# Patient Record
Sex: Male | Born: 1980 | Hispanic: Yes | Smoking: Never smoker
Health system: Southern US, Community
[De-identification: ages and names within clinical notes are randomized; demographics above are authoritative.]

---

## 2017-11-26 ENCOUNTER — Other Ambulatory Visit: Payer: Self-pay

## 2017-11-26 ENCOUNTER — Emergency Department: Payer: Self-pay

## 2017-11-26 DIAGNOSIS — G510 Bell's palsy: Secondary | ICD-10-CM | POA: Insufficient documentation

## 2017-11-26 LAB — DIFFERENTIAL
BASOS ABS: 0 10*3/uL (ref 0–0.1)
BASOS PCT: 0 %
EOS ABS: 0.7 10*3/uL (ref 0–0.7)
Eosinophils Relative: 7 %
Lymphocytes Relative: 28 %
Lymphs Abs: 2.9 10*3/uL (ref 1.0–3.6)
MONOS PCT: 7 %
Monocytes Absolute: 0.7 10*3/uL (ref 0.2–1.0)
NEUTROS ABS: 6.1 10*3/uL (ref 1.4–6.5)
Neutrophils Relative %: 58 %

## 2017-11-26 LAB — COMPREHENSIVE METABOLIC PANEL
ALBUMIN: 4.5 g/dL (ref 3.5–5.0)
ALT: 44 U/L (ref 17–63)
AST: 38 U/L (ref 15–41)
Alkaline Phosphatase: 88 U/L (ref 38–126)
Anion gap: 8 (ref 5–15)
BUN: 20 mg/dL (ref 6–20)
CO2: 26 mmol/L (ref 22–32)
Calcium: 9.3 mg/dL (ref 8.9–10.3)
Chloride: 106 mmol/L (ref 101–111)
Creatinine, Ser: 0.99 mg/dL (ref 0.61–1.24)
GFR calc Af Amer: >60 mL/min (ref >60)
Glucose, Bld: 125 mg/dL — ABNORMAL HIGH (ref 65–99)
POTASSIUM: 3.6 mmol/L (ref 3.5–5.1)
SODIUM: 140 mmol/L (ref 135–145)
Total Bilirubin: 0.7 mg/dL (ref 0.3–1.2)
Total Protein: 7.4 g/dL (ref 6.5–8.1)

## 2017-11-26 LAB — CBC
HCT: 40.8 % (ref 40.0–52.0)
Hemoglobin: 14.4 g/dL (ref 13.0–18.0)
MCH: 30.9 pg (ref 26.0–34.0)
MCHC: 35.3 g/dL (ref 32.0–36.0)
MCV: 87.5 fL (ref 80.0–100.0)
Platelets: 253 10*3/uL (ref 150–440)
RBC: 4.66 MIL/uL (ref 4.40–5.90)
RDW: 12.6 % (ref 11.5–14.5)
WBC: 10.4 10*3/uL (ref 3.8–10.6)

## 2017-11-26 LAB — TROPONIN I

## 2017-11-26 LAB — PROTIME-INR
INR: 0.87
Prothrombin Time: 11.7 seconds (ref 11.4–15.2)

## 2017-11-26 LAB — APTT: APTT: 30 s (ref 24–36)

## 2017-11-26 NOTE — ED Triage Notes (Signed)
Pt arrives to ED via POV from home with c/o facial droop and numbness that started yesterday morning. No numbness or weakness in the extremities, no trouble swallowing or breathing, some slurred speech reported. No reports of visual changes; pt reports posterior HA that started at the same time as the facial numbness. Bilateral grips are strong and equal, MAEW.

## 2017-11-27 ENCOUNTER — Emergency Department
Admission: EM | Admit: 2017-11-27 | Discharge: 2017-11-27 | Disposition: A | Discharge status: Home / Self Care | Payer: Self-pay | Attending: Emergency Medicine | Admitting: Emergency Medicine

## 2017-11-27 DIAGNOSIS — G510 Bell's palsy: Secondary | ICD-10-CM

## 2017-11-27 MED ORDER — VALACYCLOVIR HCL 500 MG PO TABS
1000.0000 mg | ORAL_TABLET | ORAL | Status: AC
  Administered 2017-11-27: 1000 mg via ORAL
  Filled 2017-11-27: qty 2

## 2017-11-27 MED ORDER — PREDNISONE 20 MG PO TABS: 60.0000 mg | ORAL_TABLET | Freq: Every day | ORAL | 0 refills | Status: DC

## 2017-11-27 MED ORDER — PREDNISONE 20 MG PO TABS
60.0000 mg | ORAL_TABLET | ORAL | Status: AC
  Administered 2017-11-27: 60 mg via ORAL
  Filled 2017-11-27: qty 3

## 2017-11-27 MED ORDER — VALACYCLOVIR HCL 1 G PO TABS: 1000.0000 mg | ORAL_TABLET | Freq: Three times a day (TID) | ORAL | 0 refills | Status: AC

## 2017-11-27 MED ORDER — PREDNISONE 20 MG PO TABS: 60.0000 mg | ORAL_TABLET | Freq: Every day | ORAL | 0 refills | Status: AC

## 2017-11-27 MED ORDER — VALACYCLOVIR HCL 1 G PO TABS: 1000.0000 mg | ORAL_TABLET | Freq: Three times a day (TID) | ORAL | 0 refills | Status: DC

## 2017-11-27 NOTE — ED Provider Notes (Signed)
[facility] Emergency Department Provider Note  ____________________________________________   First MD Initiated Contact with Patient 11/27/17 0114     (approximate)  I have reviewed the triage vital signs and the nursing notes.   HISTORY  Chief Complaint Facial Droop  The patient is a native [place] speaker but speaks English well and prefers to conduct the interview in [place].  HPI Daimen Jensen is a 37 y.o. male with no chronic medical issues and no recent illnesses who presents for evaluation of acute onset right-sided facial droop.  He was in his normal state of health when he went to bed on Wednesday night and then woke up on Thursday morning with an inability to completely close his right eye and with drooping of the right side of his mouth.  He is drooling when he tries to eat and drink and reports that he has some aching pain throughout the right side of his face and head and radiating down into his neck.  He has no history of trauma, no recent fever/chills, chest pain, shortness of breath, nausea, vomiting, abdominal pain, sore throat, or lesions on his face or ear.  Makes his symptoms better or worse.  He denies having any weakness or numbness in any of his extremities.  He has had no vertigo, double vision, nor ataxia.  Reports some slurred speech because his mouth does not work correctly but otherwise is speaking without any difficulty.  History reviewed. No pertinent past medical history.  There are no active problems to display for this patient.   History reviewed. No pertinent surgical history.  Prior to Admission medications   Medication Sig Start Date End Date Taking? Authorizing Provider  predniSONE (DELTASONE) 20 MG tablet Take 3 tablets (60 mg total) by mouth daily for 7 days. 11/27/17 12/04/17  Loleta Rose, MD  valACYclovir (VALTREX) 1000 MG tablet Take 1 tablet (1,000 mg total) by mouth 3 (three) times daily for 7 days. 11/27/17  12/04/17  Loleta Rose, MD    Allergies Amoxicillin  No family history on file.  Social History Social History   Tobacco Use  . Smoking status: Never Smoker  . Smokeless tobacco: Never Used  Substance Use Topics  . Alcohol use: Not on file  . Drug use: Not on file    Review of Systems Constitutional: No fever/chills Eyes: No visual changes, but cannot fully close his right eye.   ENT: No sore throat.  Some pain throughout the right side of his face and radiating down to the right side of his neck Cardiovascular: Denies chest pain. Respiratory: Denies shortness of breath. Gastrointestinal: No abdominal pain.  No nausea, no vomiting.  No diarrhea.  No constipation. Genitourinary: Negative for dysuria. Musculoskeletal: Negative for neck pain.  Negative for back pain. Integumentary: Negative for rash. Neurological: Mild posterior headache.  Profound right-sided facial droop, acute in onset about 24 hours ago.  No numbness nor weakness in any extremities. Psychiatric:No psychiatric complaints  ____________________________________________   PHYSICAL EXAM:  VITAL SIGNS: ED Triage Vitals  Enc Vitals Group     BP 11/26/17 2132 (!) 147/100     Pulse Rate 11/26/17 2132 68     Resp 11/26/17 2132 18     Temp 11/26/17 2132 98 F (36.7 C)     Temp Source 11/26/17 2132 Oral     SpO2 11/26/17 2132 98 %     Weight 11/26/17 2123 82.6 kg (182 lb)     Height 11/26/17 2123 1.753 m (5'  9")     Head Circumference --      Peak Flow --      Pain Score 11/26/17 2123 8     Pain Loc --      Pain Edu? --      Excl. in GC? --     Constitutional: Alert and oriented. Well appearing and in no acute distress.  Obvious right-sided facial droop as documented below. Eyes: Conjunctivae are normal. PERRL. EOMI. no nystagmus. Head: Atraumatic. Nose: No congestion/rhinnorhea. Ears: No evidence of vesicular lesions externally or within the ear canals.  Normal-appearing TMs. Mouth/Throat: Mucous  membranes are moist. Neck: No stridor.  No meningeal signs.   Cardiovascular: Normal rate, regular rhythm. Good peripheral circulation.  Respiratory: Normal respiratory effort.  No retractions.  Musculoskeletal: No lower extremity tenderness nor edema. No gross deformities of extremities. Neurologic: Obvious right-sided facial droop, inability to completely close the right eye, inability to smile on the right side of the mouth, inability to wrinkle the right side of his forehead.  No other focal neurological deficits are appreciated with normal strength throughout the rest of his body and no ataxia Skin:  Skin is warm, dry and intact. No rash noted. Psychiatric: Mood and affect are normal. Speech and behavior are normal.  ____________________________________________   LABS (all labs ordered are listed, but only abnormal results are displayed)  Labs Reviewed  COMPREHENSIVE METABOLIC PANEL - Abnormal; Notable for the following components:      Result Value   Glucose, Bld 125 (*)    All other components within normal limits  PROTIME-INR  APTT  CBC  DIFFERENTIAL  TROPONIN I   ____________________________________________  EKG  None - EKG not ordered by ED physician ____________________________________________  RADIOLOGY   Ct Head Wo Contrast  Result Date: 11/26/2017 CLINICAL DATA:  Facial droop and numbness. Slurred speech. Posterior headache. EXAM: CT HEAD WITHOUT CONTRAST TECHNIQUE: Contiguous axial images were obtained from the base of the skull through the vertex without intravenous contrast. COMPARISON:  None. FINDINGS: Brain: No evidence of acute infarction, hemorrhage, hydrocephalus, extra-axial collection or mass lesion/mass effect. Vascular: No hyperdense vessel or unexpected calcification. Skull: Normal. Negative for fracture or focal lesion. Sinuses/Orbits: No acute finding. Other: None. IMPRESSION: No acute intracranial abnormality. Electronically Signed   By: Ted Mcalpineobrinka   Dimitrova M.D.   On: 11/26/2017 21:49    ____________________________________________   PROCEDURES  Critical Care performed: No   Procedure(s) performed:   Procedures   ____________________________________________   INITIAL IMPRESSION / ASSESSMENT AND PLAN / ED COURSE  As part of my medical decision making, I reviewed the following data within the electronic MEDICAL RECORD NUMBER Nursing notes reviewed and incorporated and Labs reviewed     Differential diagnosis includes, but is not limited to, Bell's palsy, Ramsay Hunt syndrome, CVA, nonspecific facial nerve paralysis or neuritis, etc.  However, the patient's history of present illness and presentation is classic for Bell's palsy.  His CT scan obtained in triage was unremarkable.  The onset was acute, he is young and otherwise healthy, he has no other focal neural deficits, and he has an inability to wrinkle his forehead on the affected side.  All these are consistent with a peripheral cause.  He has no vesicular lesions in which would suggest more of a Ramsay Hunt presentation.  I explained to him the diagnosis and recommended treatment.  I provided coupons via [email or web address] to help with the cost of the medication.  I gave my usual and customary return precautions  and he understands and agrees with the plan.  I also stressed the importance of eye lubrication with ointment and drops and I provided tape and gauze for him to tape his eye shut at night for added protection.  Also provided information to help him establish a primary care doctor.     ____________________________________________  FINAL CLINICAL IMPRESSION(S) / ED DIAGNOSES  Final diagnoses:  Right-sided Bell's palsy     MEDICATIONS GIVEN DURING THIS VISIT:  Medications  predniSONE (DELTASONE) tablet 60 mg (60 mg Oral Given 11/27/17 0144)  valACYclovir (VALTREX) tablet 1,000 mg (1,000 mg Oral Given 11/27/17 0144)     ED Discharge Orders        Ordered    predniSONE  (DELTASONE) 20 MG tablet  Daily,   Status:  Discontinued     11/27/17 0143    valACYclovir (VALTREX) 1000 MG tablet  3 times daily,   Status:  Discontinued     11/27/17 0143    valACYclovir (VALTREX) 1000 MG tablet  3 times daily     11/27/17 0144    predniSONE (DELTASONE) 20 MG tablet  Daily     11/27/17 0144       Note:  This document was prepared using Dragon voice recognition software and may include unintentional dictation errors.    Loleta Rose, MD 11/27/17 [phone]

## 2017-11-27 NOTE — ED Notes (Signed)
Pt to the er for pain in his right eye, shoulder and ear. Difficult to close the right eye. Droop to the right side of the face. Pt having difficulty closing the right side of the mouth. Pt went to bed and woke this way. Dr York CeriseForbach to bedside.

## 2018-09-15 IMAGING — CT CT HEAD W/O CM
3 series · 15 of 46 positions shown, 18 images · non-contrast
Comparison: None.

CLINICAL DATA: Facial droop and numbness. Slurred speech. Posterior
headache.

EXAM:
CT HEAD WITHOUT CONTRAST
TECHNIQUE: Contiguous axial images were obtained from the base of the skull
through the vertex without intravenous contrast.

[Series 2: head wo · axial · 0.41mm/px · z∈[+430,+550]mm · 9 of 29 slices shown, 12 images]
[im 3/29  brain]
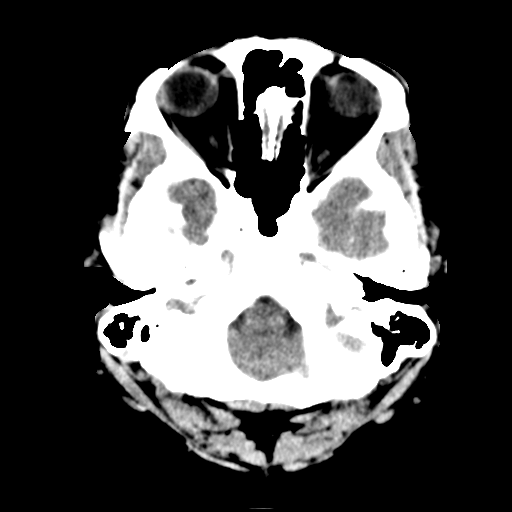
[im 3/29  bone]
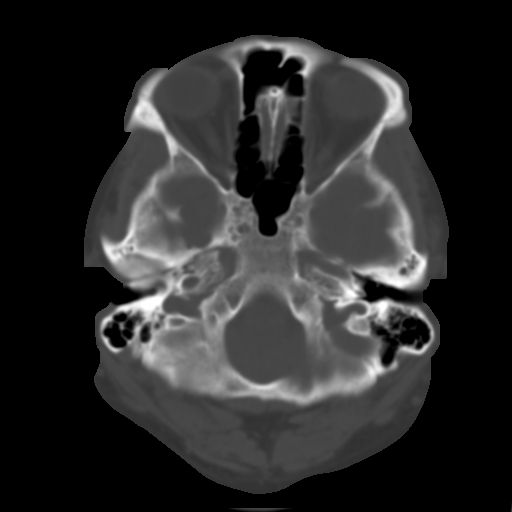
[im 6/29  brain]
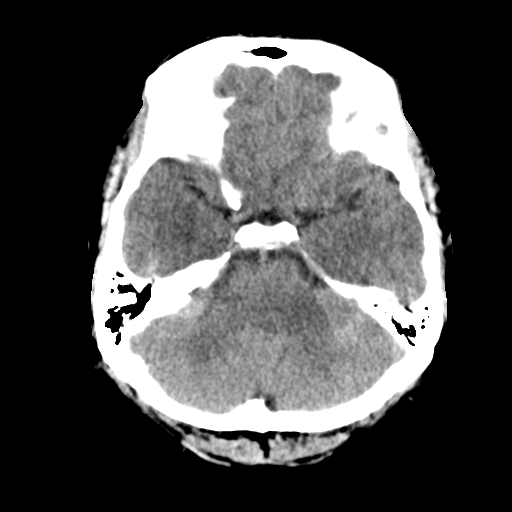
[im 9/29  brain]
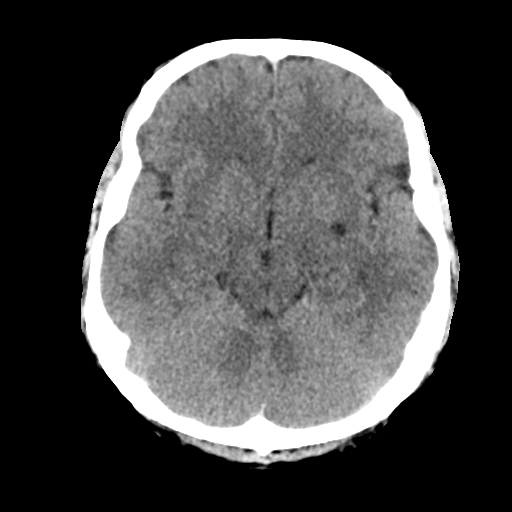
[im 12/29  brain]
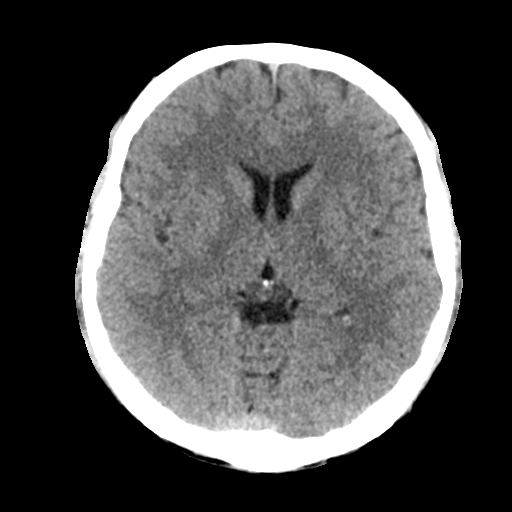
[im 15/29  brain]
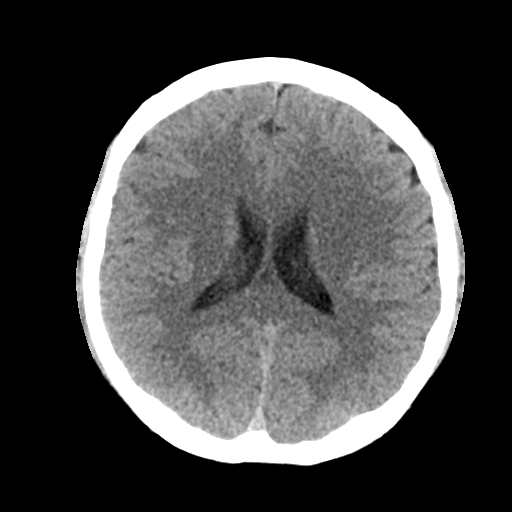
[im 15/29  bone]
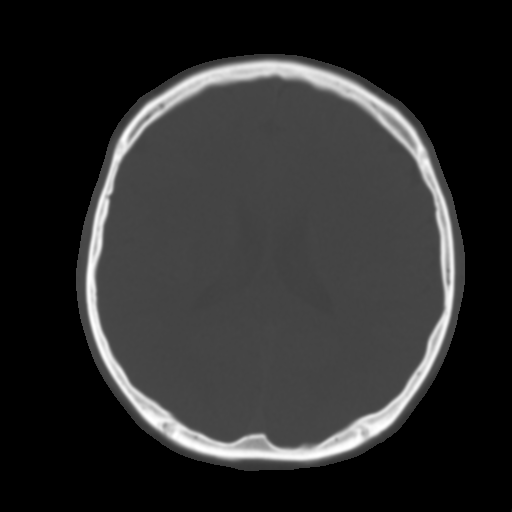
[im 18/29  brain]
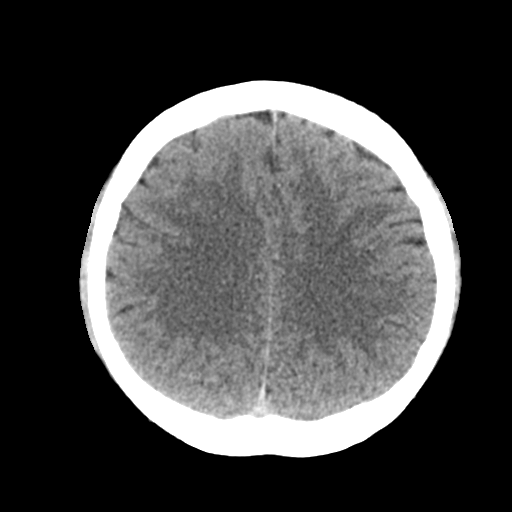
[im 21/29  brain]
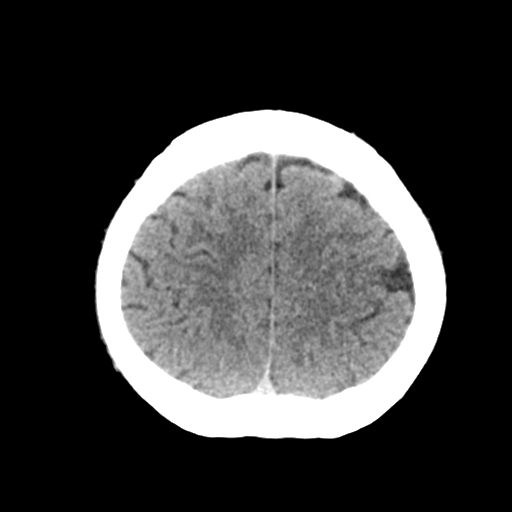
[im 24/29  brain]
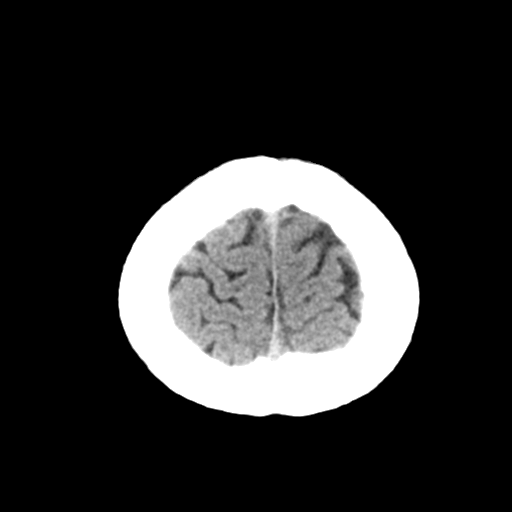
[im 27/29  brain]
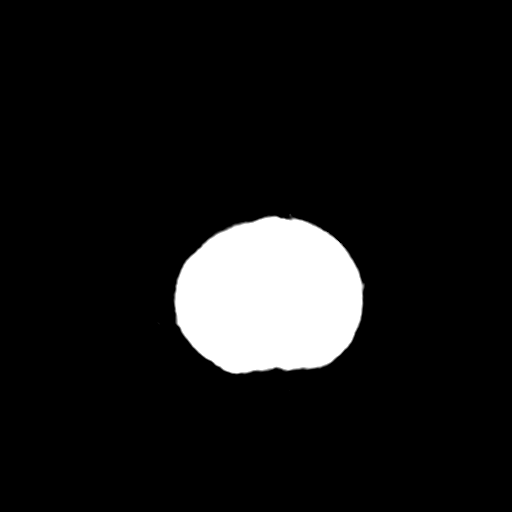
[im 27/29  bone]
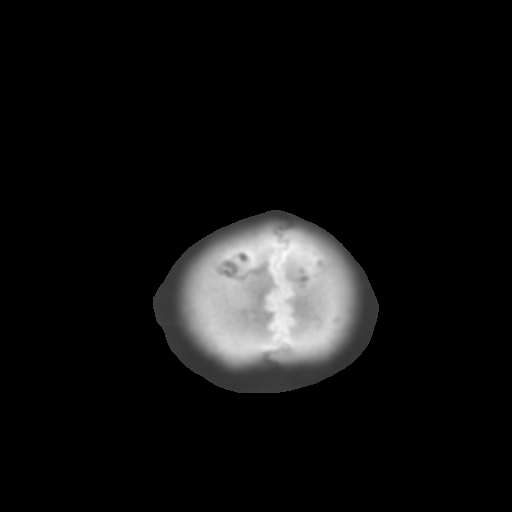

[Series 4: coronal soft tissue · coronal · 0.30mm/px · 3 of 62 slices shown]
[im 21/62  brain]
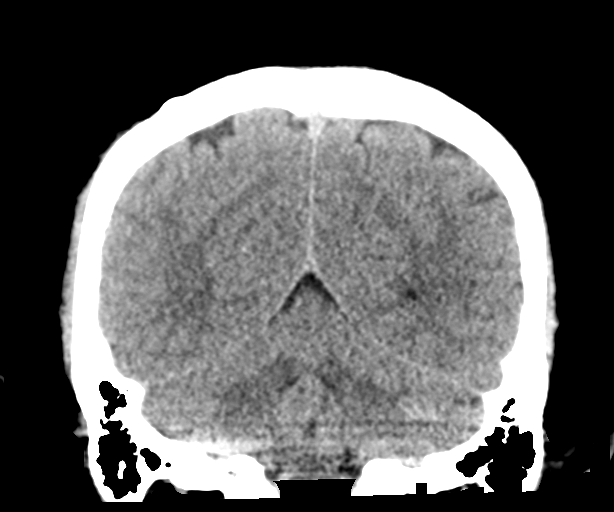
[im 28/62  brain]
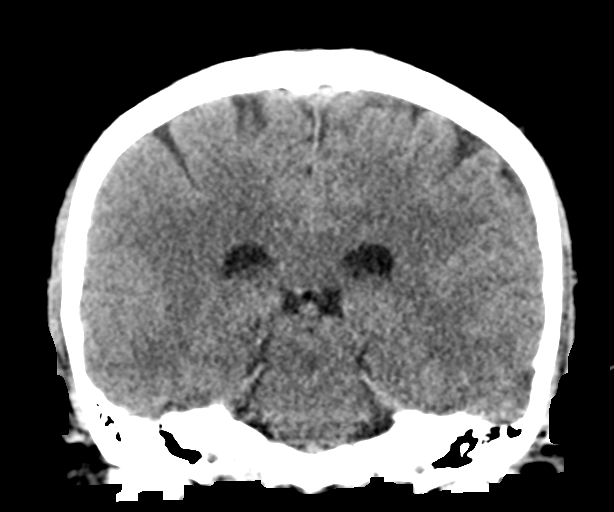
[im 34/62  brain]
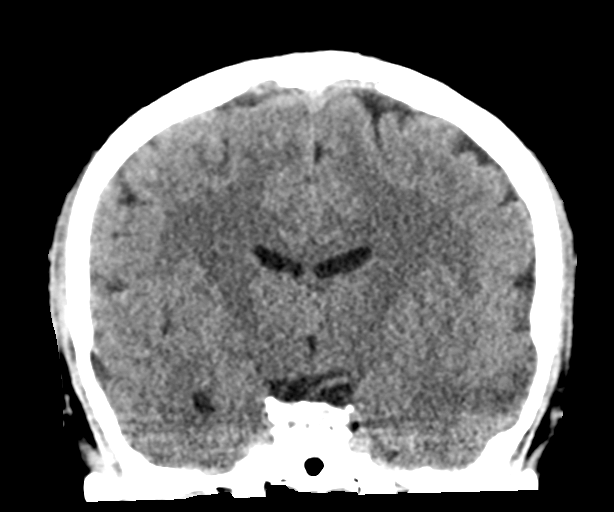

[Series 5: sagittal soft tissue · sagittal · 0.29mm/px · 3 of 57 slices shown]
[im 19/57  brain]
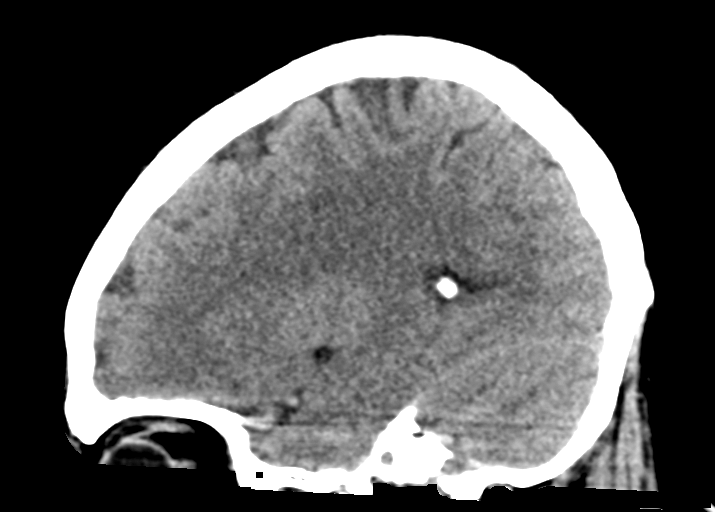
[im 29/57  brain]
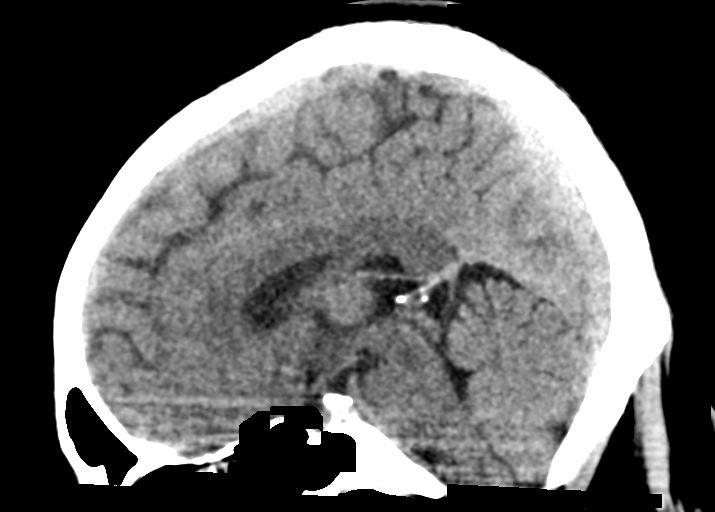
[im 38/57  brain]
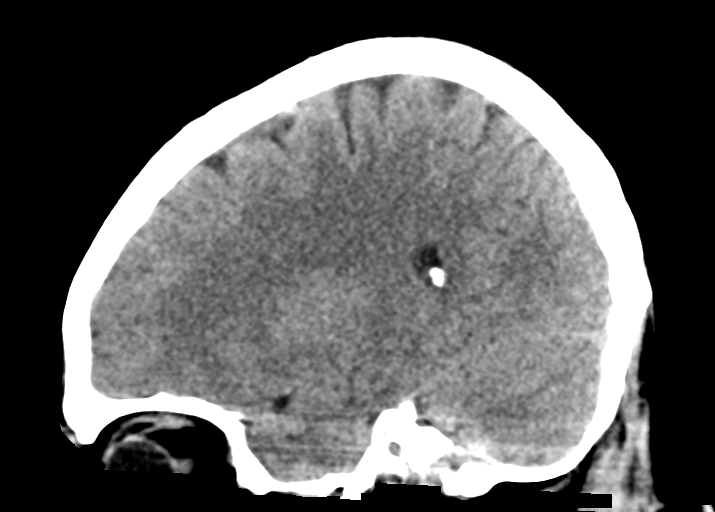

[15 of 46 positions shown; findings below may reference images not displayed]

FINDINGS: Brain: No evidence of acute infarction, hemorrhage, hydrocephalus,
extra-axial collection or mass lesion/mass effect.

Vascular: No hyperdense vessel or unexpected calcification.

Skull: Normal. Negative for fracture or focal lesion.

Sinuses/Orbits: No acute finding.

Other: None.
IMPRESSION: No acute intracranial abnormality.
